# Patient Record
Sex: Male | Born: 1980 | Hispanic: Yes | Marital: Single | Smoking: Never smoker
Health system: Southern US, Community
[De-identification: ages and names within clinical notes are randomized; demographics above are authoritative.]

---

## 2015-03-22 ENCOUNTER — Encounter (HOSPITAL_COMMUNITY): Payer: Self-pay | Admitting: Emergency Medicine

## 2015-03-22 ENCOUNTER — Emergency Department (HOSPITAL_COMMUNITY): Payer: Self-pay

## 2015-03-22 ENCOUNTER — Emergency Department (HOSPITAL_COMMUNITY)
Admission: EM | Admit: 2015-03-22 | Discharge: 2015-03-22 | Disposition: A | Discharge status: Home / Self Care | Payer: Self-pay | Attending: Emergency Medicine | Admitting: Emergency Medicine

## 2015-03-22 DIAGNOSIS — R1900 Intra-abdominal and pelvic swelling, mass and lump, unspecified site: Secondary | ICD-10-CM

## 2015-03-22 DIAGNOSIS — R319 Hematuria, unspecified: Secondary | ICD-10-CM

## 2015-03-22 DIAGNOSIS — R1032 Left lower quadrant pain: Secondary | ICD-10-CM | POA: Insufficient documentation

## 2015-03-22 DIAGNOSIS — R1909 Other intra-abdominal and pelvic swelling, mass and lump: Secondary | ICD-10-CM | POA: Insufficient documentation

## 2015-03-22 LAB — URINALYSIS, ROUTINE W REFLEX MICROSCOPIC
BILIRUBIN URINE: NEGATIVE
Glucose, UA: NEGATIVE mg/dL
Ketones, ur: NEGATIVE mg/dL
Leukocytes, UA: NEGATIVE
Nitrite: NEGATIVE
PH: 6 (ref 5.0–8.0)
Protein, ur: NEGATIVE mg/dL
SPECIFIC GRAVITY, URINE: 1.029 (ref 1.005–1.030)
Urobilinogen, UA: 1 mg/dL (ref 0.0–1.0)

## 2015-03-22 LAB — CBC WITH DIFFERENTIAL/PLATELET
BASOS PCT: 0 % (ref 0–1)
Basophils Absolute: 0 10*3/uL (ref 0.0–0.1)
EOS PCT: 4 % (ref 0–5)
Eosinophils Absolute: 0.4 10*3/uL (ref 0.0–0.7)
HCT: 42.1 % (ref 39.0–52.0)
Hemoglobin: 14.6 g/dL (ref 13.0–17.0)
LYMPHS ABS: 4.1 10*3/uL — AB (ref 0.7–4.0)
Lymphocytes Relative: 41 % (ref 12–46)
MCH: 31.1 pg (ref 26.0–34.0)
MCHC: 34.7 g/dL (ref 30.0–36.0)
MCV: 89.8 fL (ref 78.0–100.0)
MONO ABS: 1 10*3/uL (ref 0.1–1.0)
MONOS PCT: 10 % (ref 3–12)
NEUTROS PCT: 45 % (ref 43–77)
Neutro Abs: 4.5 10*3/uL (ref 1.7–7.7)
PLATELETS: 212 10*3/uL (ref 150–400)
RBC: 4.69 MIL/uL (ref 4.22–5.81)
RDW: 12.9 % (ref 11.5–15.5)
WBC: 10 10*3/uL (ref 4.0–10.5)

## 2015-03-22 LAB — COMPREHENSIVE METABOLIC PANEL
ALT: 52 U/L (ref 17–63)
AST: 39 U/L (ref 15–41)
Albumin: 4 g/dL (ref 3.5–5.0)
Alkaline Phosphatase: 110 U/L (ref 38–126)
Anion gap: 13 (ref 5–15)
BUN: 19 mg/dL (ref 6–20)
CALCIUM: 8.6 mg/dL — AB (ref 8.9–10.3)
CHLORIDE: 101 mmol/L (ref 101–111)
CO2: 22 mmol/L (ref 22–32)
Creatinine, Ser: 0.99 mg/dL (ref 0.61–1.24)
GFR calc Af Amer: >60 mL/min (ref >60)
GFR calc non Af Amer: >60 mL/min (ref >60)
GLUCOSE: 118 mg/dL — AB (ref 70–99)
Potassium: 3.8 mmol/L (ref 3.5–5.1)
SODIUM: 136 mmol/L (ref 135–145)
Total Bilirubin: 0.9 mg/dL (ref 0.3–1.2)
Total Protein: 6.6 g/dL (ref 6.5–8.1)

## 2015-03-22 LAB — LIPASE, BLOOD: Lipase: 18 U/L — ABNORMAL LOW (ref 22–51)

## 2015-03-22 LAB — URINE MICROSCOPIC-ADD ON

## 2015-03-22 MED ORDER — IOHEXOL 300 MG/ML  SOLN
25.0000 mL | Freq: Once | INTRAMUSCULAR | Status: AC | PRN
  Administered 2015-03-22: 25 mL via ORAL

## 2015-03-22 MED ORDER — DICYCLOMINE HCL 20 MG PO TABS: 20.0000 mg | ORAL_TABLET | Freq: Two times a day (BID) | ORAL | Status: AC

## 2015-03-22 MED ORDER — IOHEXOL 300 MG/ML  SOLN
100.0000 mL | Freq: Once | INTRAMUSCULAR | Status: AC | PRN
  Administered 2015-03-22: 100 mL via INTRAVENOUS

## 2015-03-22 MED ORDER — ONDANSETRON HCL 4 MG PO TABS: 4.0000 mg | ORAL_TABLET | Freq: Three times a day (TID) | ORAL | Status: AC | PRN

## 2015-03-22 MED ORDER — MORPHINE SULFATE 4 MG/ML IJ SOLN
4.0000 mg | Freq: Once | INTRAMUSCULAR | Status: AC
Start: 2015-03-22 — End: 2015-03-22
  Administered 2015-03-22: 4 mg via INTRAVENOUS
  Filled 2015-03-22: qty 1

## 2015-03-22 MED ORDER — KETOROLAC TROMETHAMINE 30 MG/ML IJ SOLN
30.0000 mg | Freq: Once | INTRAMUSCULAR | Status: AC
  Administered 2015-03-22: 30 mg via INTRAVENOUS
  Filled 2015-03-22: qty 1

## 2015-03-22 MED ORDER — SODIUM CHLORIDE 0.9 % IV BOLUS (SEPSIS)
1000.0000 mL | Freq: Once | INTRAVENOUS | Status: AC
  Administered 2015-03-22: 1000 mL via INTRAVENOUS

## 2015-03-22 MED ORDER — HYDROCODONE-ACETAMINOPHEN 5-325 MG PO TABS: 1.0000 | ORAL_TABLET | Freq: Four times a day (QID) | ORAL | Status: AC | PRN

## 2015-03-22 NOTE — ED Notes (Signed)
Pt reports sudden onset LLQ pain that started 30 minutes, pain woke pt up from his sleep. Denies urinary issues, last BM last night.

## 2015-03-22 NOTE — ED Notes (Signed)
Pt off unit with CT 

## 2015-03-22 NOTE — Discharge Instructions (Signed)
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 It is not the cause of your pain today. Your ct scan did show some inflammation of the stomach. This is consistent with gastritis. It may be caused by a virus or something you ate. I am discharging you with pain medication. Please follow up with the gastroenterologist if  Your pain does not go away or continues  Abdominal (belly) pain can be caused by many things. Your caregiver performed an examination and possibly ordered blood/urine tests and imaging (CT scan, x-rays, ultrasound). Many cases can be observed and treated at home after initial evaluation in the emergency department. Even though you are being discharged home, abdominal pain can be unpredictable. Therefore, you need a repeated exam if your pain does not resolve, returns, or worsens. Most patients with abdominal pain don't have to be admitted to the hospital or have surgery, but serious problems like appendicitis and gallbladder attacks can start out as nonspecific pain. Many abdominal conditions cannot be diagnosed in one visit, so follow-up evaluations are very important. SEEK IMMEDIATE MEDICAL ATTENTION IF: The pain does not go away or becomes severe.  A temperature above 101 develops.  Repeated vomiting occurs (multiple episodes).  The pain becomes localized to portions of the abdomen. The right side could possibly be appendicitis. In an adult, the left lower portion of the abdomen could be colitis or diverticulitis.  Blood is being passed in stools or vomit (bright red or black tarry stools).  Return also if you develop chest pain, difficulty breathing, dizziness or fainting, or become confused, poorly responsive, or inconsolable (young children).    Dolor abdominal (Abdominal Pain) El dolor puede tener muchas causas. Normalmente la causa del dolor abdominal no es una enfermedad y Scientist, clinical (histocompatibility and immunogenetics)mejorar sin  TEFL teachertratamiento. Frecuentemente puede controlarse y tratarse en casa. Su mdico le Medical sales representativerealizar un examen fsico y posiblemente solicite anlisis de sangre y radiografas para ayudar a Chief Strategy Officerdeterminar la gravedad de su dolor. Sin embargo, en IAC/InterActiveCorpmuchos casos, debe transcurrir ms tiempo antes de que se pueda Clinical research associateencontrar una causa evidente del dolor. Antes de llegar a ese punto, es posible que su mdico no sepa si necesita ms pruebas o un tratamiento ms profundo. INSTRUCCIONES PARA EL CUIDADO EN EL HOGAR  Est atento al dolor para ver si hay cambios. Las siguientes indicaciones ayudarn a Architectural technologistaliviar cualquier molestia que pueda sentir:  Auroraome solo medicamentos de venta libre o recetados, segn las indicaciones del mdico.  No tome laxantes a menos que se lo haya indicado su mdico.  Pruebe con Neomia Dearuna dieta lquida absoluta (caldo, t o agua) segn se lo indique su mdico. Introduzca gradualmente una dieta normal, segn su tolerancia. SOLICITE ATENCIN MDICA SI:  Tiene dolor abdominal sin explicacin.  Tiene dolor abdominal relacionado con nuseas o diarrea.  Tiene dolor cuando orina o defeca.  Experimenta dolor abdominal que lo despierta de noche.  Tiene dolor abdominal que empeora o mejora cuando come alimentos.  Tiene dolor abdominal que empeora cuando come alimentos grasosos.  Tiene fiebre. SOLICITE ATENCIN MDICA DE INMEDIATO SI:   El dolor no desaparece en un plazo mximo de 2horas.  No deja de (vomitar).  El Engineer, miningdolor se siente solo en partes del abdomen, como el lado derecho o la parte inferior izquierda del abdomen.  Evaca materia fecal sanguinolenta o negra, de aspecto alquitranado. ASEGRESE DE QUE:  Comprende estas instrucciones.  Controlar su afeccin.  Recibir [place] Grenadinesayuda de inmediato  si no mejora o si empeora. Document Released: 10/29/2005 Document Revised: 11/03/2013 [facility] Patient Information 2015 [place], [place]. This information is not intended to replace advice given to you by your  health care provider. Make sure you discuss any questions you have with your health care provider. Gastritis, Adult Gastritis is soreness and swelling (inflammation) of the lining of the stomach. Gastritis can develop as a sudden onset (acute) or long-term (chronic) condition. If gastritis is not treated, it can lead to stomach bleeding and ulcers. CAUSES  Gastritis occurs when the stomach lining is weak or damaged. Digestive juices from the stomach then inflame the weakened stomach lining. The stomach lining may be weak or damaged due to viral or bacterial infections. One common bacterial infection is the Helicobacter pylori infection. Gastritis can also result from excessive alcohol consumption, taking certain medicines, or having too much acid in the stomach.  SYMPTOMS  In some cases, there are no symptoms. When symptoms are present, they may include:  Pain or a burning sensation in the upper abdomen.  Nausea.  Vomiting.  An uncomfortable feeling of fullness after eating. DIAGNOSIS  Your caregiver may suspect you have gastritis based on your symptoms and a physical exam. To determine the cause of your gastritis, your caregiver may perform the following:  Blood or stool tests to check for the H pylori bacterium.  Gastroscopy. A thin, flexible tube (endoscope) is passed down the esophagus and into the stomach. The endoscope has a light and camera on the end. Your caregiver uses the endoscope to view the inside of the stomach.  Taking a tissue sample (biopsy) from the stomach to examine under a microscope. TREATMENT  Depending on the cause of your gastritis, medicines may be prescribed. If you have a bacterial infection, such as an H pylori infection, antibiotics may be given. If your gastritis is caused by too much acid in the stomach, H2 blockers or antacids may be given. Your caregiver may recommend that you stop taking aspirin, ibuprofen, or other nonsteroidal anti-inflammatory drugs  (NSAIDs). HOME CARE INSTRUCTIONS  Only take over-the-counter or prescription medicines as directed by your caregiver.  If you were given antibiotic medicines, take them as directed. Finish them even if you start to feel better.  Drink enough fluids to keep your urine clear or pale yellow.  Avoid foods and drinks that make your symptoms worse, such as:  Caffeine or alcoholic drinks.  Chocolate.  Peppermint or mint flavorings.  Garlic and onions.  Spicy foods.  Citrus fruits, such as oranges, lemons, or limes.  Tomato-based foods such as sauce, chili, salsa, and pizza.  Fried and fatty foods.  Eat small, frequent meals instead of large meals. SEEK IMMEDIATE MEDICAL CARE IF:   You have black or dark red stools.  You vomit blood or material that looks like coffee grounds.  You are unable to keep fluids down.  Your abdominal pain gets worse.  You have a fever.  You do not feel better after 1 week.  You have any other questions or concerns. MAKE SURE YOU:  Understand these instructions.  Will watch your condition.  Will get help right away if you are not doing well or get worse. Document Released: 10/23/2001 Document Revised: 04/29/2012 Document Reviewed: 12/12/2011 [facility] Patient Information 2015 [place], [place]. This information is not intended to replace advice given to you by your health care provider. Make sure you discuss any questions you have with your health care provider.  Hematuria (Hematuria) La hematuria es la  presencia de [place] en la orina. La causa puede ser una infeccin en la vejiga, en los riones, en la prstata, clculos renales o cncer de las vas urinarias. Normalmente las infecciones pueden tratarse con medicamentos, y los clculos renales, por lo general, se eliminarn a travs de la orina. Si ninguna de estas es la causa de su hematuria, quizs se necesiten ms estudios para Building services engineerdescubrir el motivo. Es muy importante que le informe a su  mdico si ve sangre en la [place], aunque el sangrado se detenga sin tratamiento o no cause dolor. La sangre que aparece en la orina y luego se detiene y vuelve a aparecer nuevamente puede ser un sntoma de una enfermedad muy grave. Adems, el dolor no es un sntoma en las etapas iniciales de muchos tipos de cncer urinarios. INSTRUCCIONES PARA EL CUIDADO EN EL HOGAR   Beba mucho lquido, entre 3 a Risk analyst4litros por da. Si le han diagnosticado una infeccin, se recomienda especialmente el jugo de arndanos rojos, [address] de grandes cantidades de [place].  Evite la cafena, el t y las bebidas con gas porque suelen irritar la vejiga.  Evite el alcohol ya que puede Teacher, adult educationirritar la prstata.  Tome todos los Monsanto Companymedicamentos como se lo haya indicado el mdico.  Si le recetaron antibiticos, asegrese de terminarlos, incluso si comienza a sentirse mejor.  Si le han diagnosticado clculos renales, siga las instrucciones de su mdico con respecto a Ecologistfiltrar la orina para TEFL teacherrescatar el clculo.  Vace la vejiga con frecuencia. Evite retener la orina durante largos perodos.  Despus de defecar, las mujeres deben higienizarse desde adelante hacia atrs. Use solo un papel por vez.  Si es AmerisourceBergen Corporationuna mujer, vace la vejiga antes y despus de Management consultanttener relaciones sexuales. SOLICITE ATENCIN MDICA SI:  Siente dolor en la espalda.  Tiene fiebre.  Tiene sensacin de Programme researcher, broadcasting/film/videomalestar estomacal (nuseas) o vmitos.  Los sntomas no mejoran despus de [address] das. Si la condicin empeora, visite al mdico antes de lo previsto. SOLICITE ATENCIN MDICA DE INMEDIATO SI:   Vomita con frecuencia e intensamente y no puede tolerar los medicamentos.  Siente un dolor intenso en la espalda o el abdomen incluso tomando los medicamentos.  Elimina cogulos grandes o sangre con la [place].  Se siente extremadamente dbil, se desmaya o pierde el conocimiento. ASEGRESE DE QUE:   Comprende estas instrucciones.  Controlar su afeccin.  Recibir ayuda  de inmediato si no mejora o si empeora. Document Released: 10/29/2005 Document Revised: 03/15/2014 [facility] Patient Information 2015 [place], [place]. This information is not intended to replace advice given to you by your health care provider. Make sure you discuss any questions you have with your health care provider.

## 2015-03-22 NOTE — ED Provider Notes (Signed)
CSN: [id number]     Arrival date & time 03/22/15  0536 History   First MD Initiated Contact with Patient 03/22/15 [phone]     Chief Complaint  Patient presents with  . Abdominal Pain     (Consider location/radiation/quality/duration/timing/severity/associated sxs/prior Treatment) HPI  Melvin Collins is a(n) 34 y.o. male who presents to the emergency department with chief complaint of left lower quadrant abdominal pain. Translation is provided by his male companion. Patient complains of left lower quadrant abdominal pain that awoke him from sleep this morning. He states that it was a 5 out of 10 at the beginning but reached 10 out of 10 pain. He describes it as intermittent, colicky, at times severe. He had associated diaphoresis and nausea without vomiting. He denies any history of constipation, previous abdominal surgeries, fevers. He denies any urinary symptoms. He has family history of a father who has frequent kidney stones. He denies a personal history of kidney stones.  History reviewed. No pertinent past medical history. History reviewed. No pertinent past surgical history. No family history on file. History  Substance Use Topics  . Smoking status: Never Smoker   . Smokeless tobacco: Not on file  . Alcohol Use: Yes    Review of Systems  Ten systems reviewed and are negative for acute change, except as noted in the HPI.    Allergies  Review of patient's allergies indicates no known allergies.  Home Medications   Prior to Admission medications   Not on File   BP 131/84 mmHg  Pulse 71  Temp(Src) 98.2 F (36.8 C) (Oral)  Resp 20  Ht 5\' 5"  (1.651 m)  Wt 185 lb (83.915 kg)  BMI 30.79 kg/m2  SpO2 96% Physical Exam  Constitutional: He appears well-developed and well-nourished. No distress.  HENT:  Head: Normocephalic and atraumatic.  Eyes: Conjunctivae are normal. No scleral icterus.  Neck: Normal range of motion. Neck supple.  Cardiovascular: Normal rate, regular  rhythm and normal heart sounds.   Pulmonary/Chest: Effort normal and breath sounds normal. No respiratory distress.  Abdominal: Soft. Bowel sounds are normal. He exhibits no distension and no mass. There is no tenderness. There is no guarding and no CVA tenderness.    Musculoskeletal: He exhibits no edema.  Neurological: He is alert.  Skin: Skin is warm and dry. He is not diaphoretic.  Psychiatric: His behavior is normal.  Nursing note and vitals reviewed.   ED Course  Procedures (including critical care time) Labs Review Labs Reviewed  COMPREHENSIVE METABOLIC PANEL - Abnormal; Notable for the following:    Glucose, Bld 118 (*)    Calcium 8.6 (*)    All other components within normal limits  LIPASE, BLOOD - Abnormal; Notable for the following:    Lipase 18 (*)    All other components within normal limits  URINALYSIS, ROUTINE W REFLEX MICROSCOPIC - Abnormal; Notable for the following:    Hgb urine dipstick MODERATE (*)    All other components within normal limits  CBC WITH DIFFERENTIAL/PLATELET  URINE MICROSCOPIC-ADD ON    Imaging Review No results found.   EKG Interpretation None      MDM   Final diagnoses:  LLQ abdominal pain  Abdominal mass  Hematuria    Patien here with sudden onset abdominal pain. He c/o severe colicky pain. Labs are reassuring.  +hgb in Urine. Will obtain a CT stone study.   9:27 AM BP 126/81 mmHg  Pulse 72  Temp(Src) 98.2 F (36.8 C) (Oral)  Resp 26  Ht 5\' 5"  (1.651 m)  Wt 185 lb (83.915 kg)  BMI 30.79 kg/m2  SpO2 97% Patient with Abdominal mass not welll visualized on non-contrast exam. Patient denies previous hx of abdominal problems previously. The patient has no OP follow up and has agreed to have a repeat scan here today for further evaluaion.   CT scan shows a splenule. His pain has improved with treatment. He does have positive hemoglobinuria, but there is no sign of kidney stone. Patient appears stable for discharge at this  time.   Arthor CaptainAbigail Jayci Ellefson, PA-C 03/25/15 [id number]  Tomasita CrumbleAdeleke Oni, MD 03/26/15 [phone]

## 2015-03-22 NOTE — ED Notes (Signed)
Pt finished contrast, CT called.

## 2015-03-22 NOTE — ED Notes (Signed)
Returned from CT.

## 2016-05-17 IMAGING — CT CT RENAL STONE PROTOCOL
2 of 4 series · 16 of 46 positions shown, 18 images · non-contrast
Comparison: None.

CLINICAL DATA: 33-year-old male with abdominal and back pain since
this morning. Left lower quadrant pain. Initial encounter.

EXAM:
CT ABDOMEN AND PELVIS WITHOUT CONTRAST
TECHNIQUE: Multidetector CT imaging of the abdomen and pelvis was performed
following the standard protocol without IV contrast.

[Series 2: stone study 5.0 i30f 1 · axial · 0.79mm/px · z∈[-269,+171]mm · 13 of 98 slices shown, 15 images]
[im 5/98  soft-tissue]
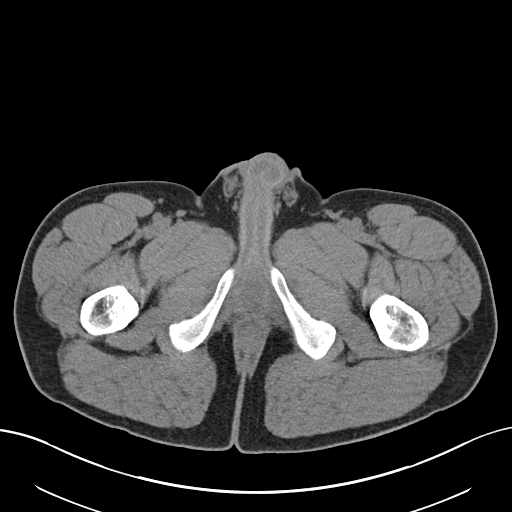
[im 5/98  bone]
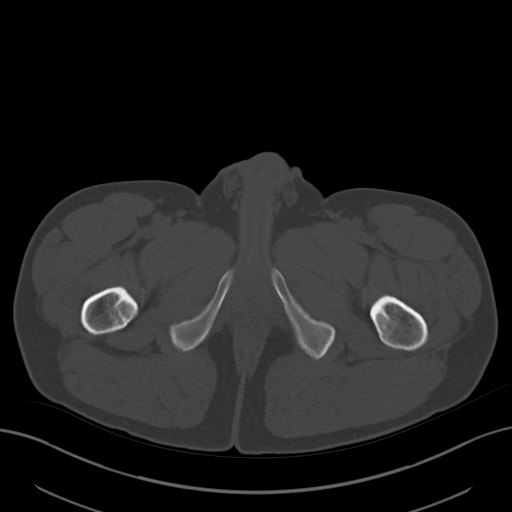
[im 13/98  soft-tissue]
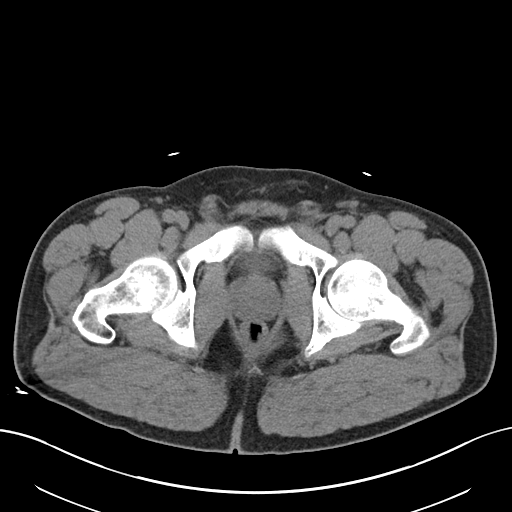
[im 22/98  soft-tissue]
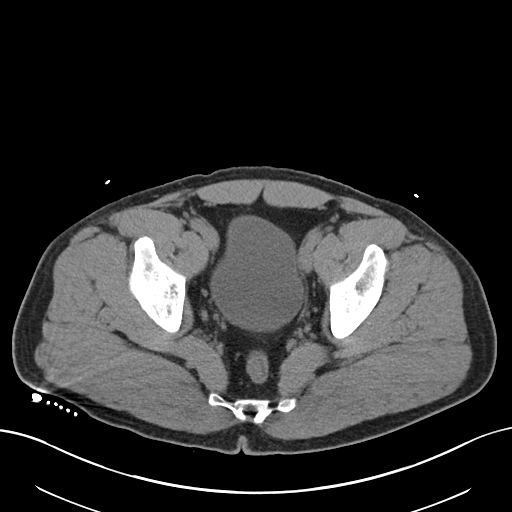
[im 26/98  soft-tissue]
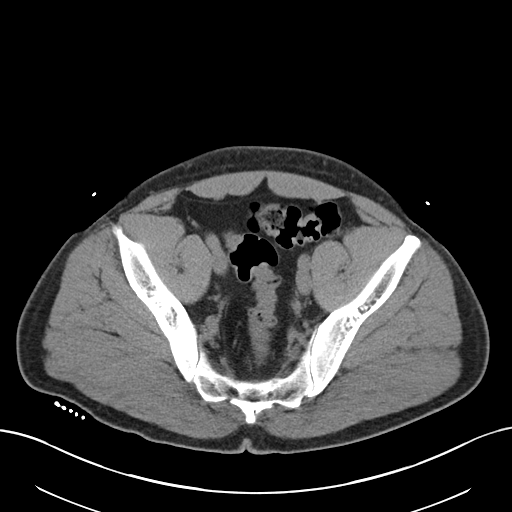
[im 34/98  soft-tissue]
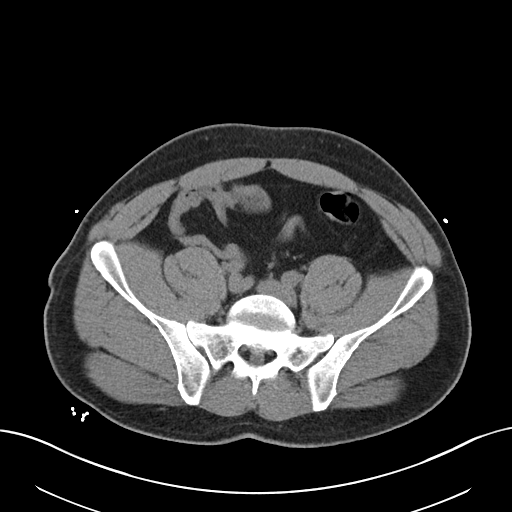
[im 43/98  soft-tissue]
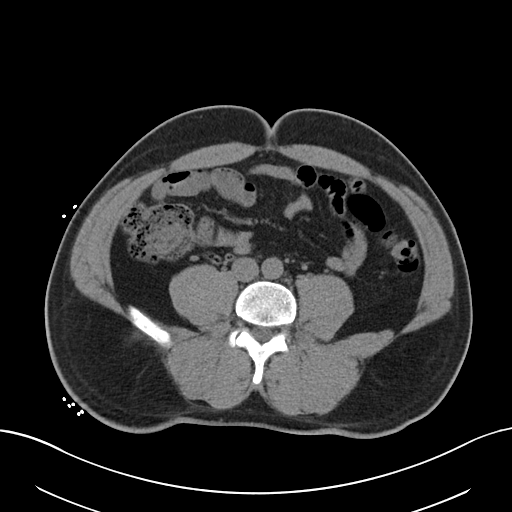
[im 51/98  soft-tissue]
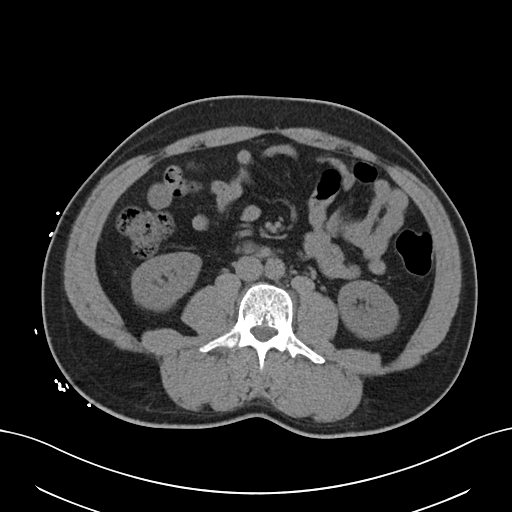
[im 55/98  soft-tissue]
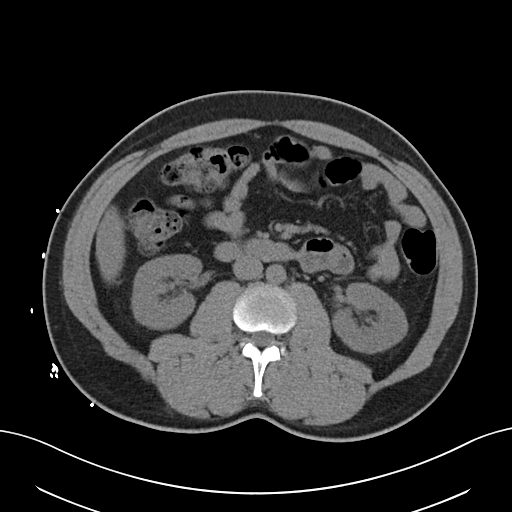
[im 64/98  soft-tissue]
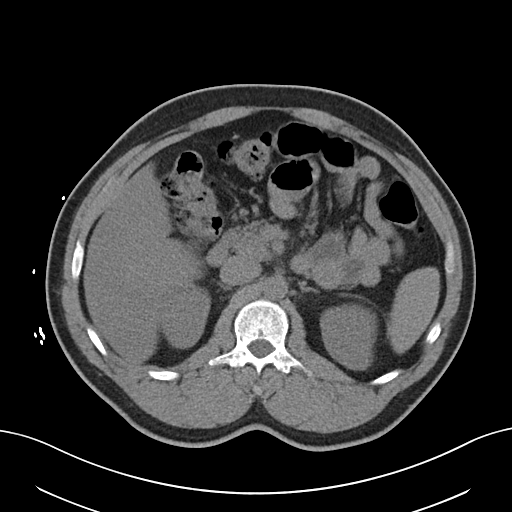
[im 64/98  bone]
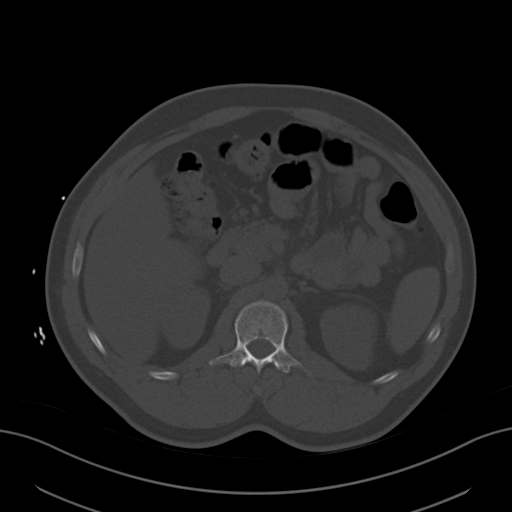
[im 72/98  soft-tissue]
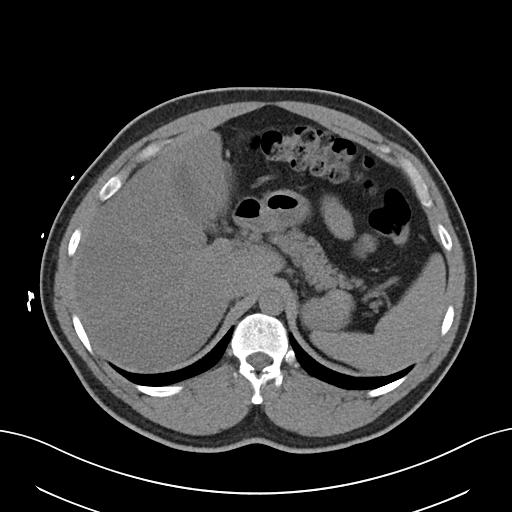
[im 76/98  soft-tissue]
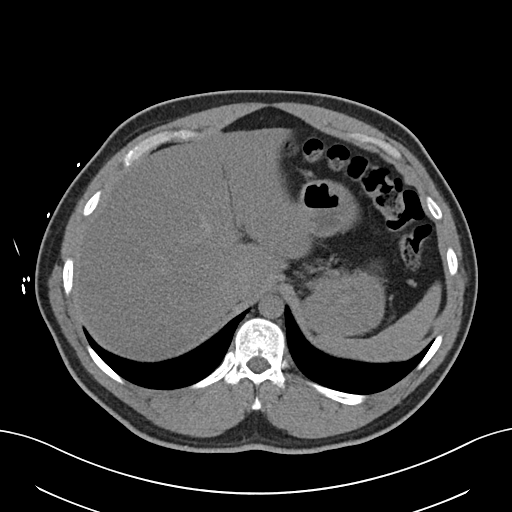
[im 85/98  soft-tissue]
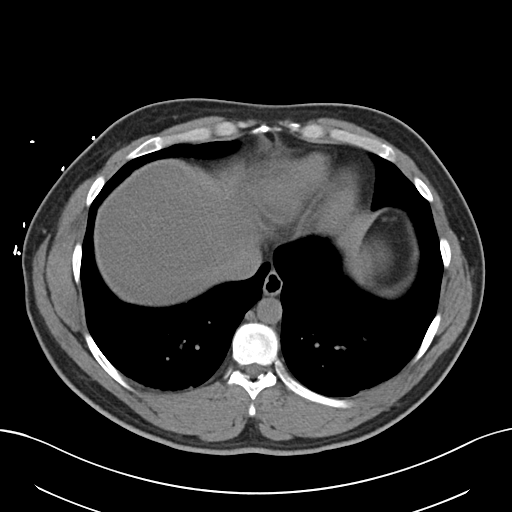
[im 93/98  soft-tissue]
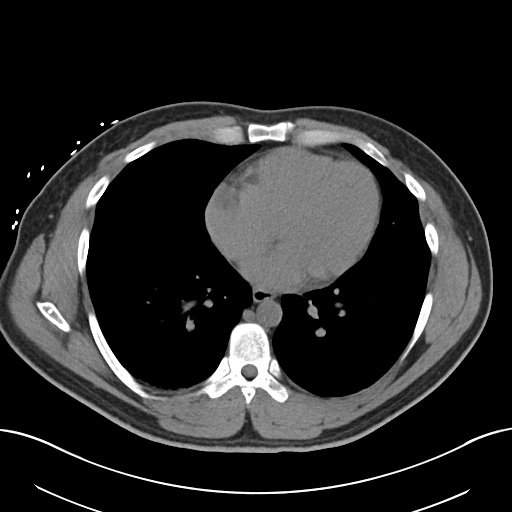

[Series 5: coronal soft tissue · coronal · 0.78mm/px · 3 of 84 slices shown]
[im 28/84  soft-tissue]
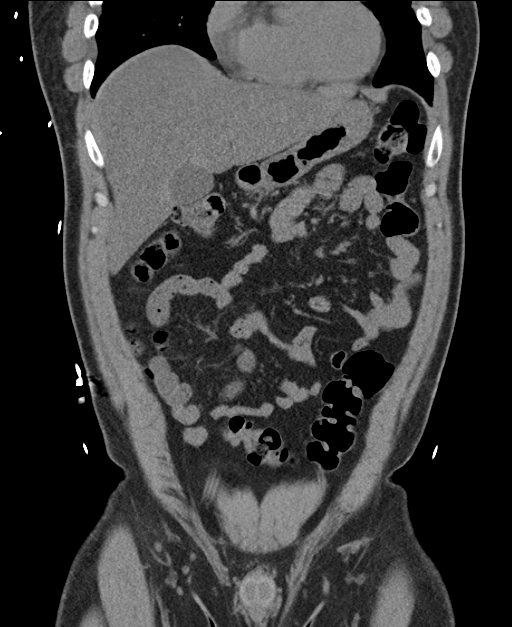
[im 37/84  soft-tissue]
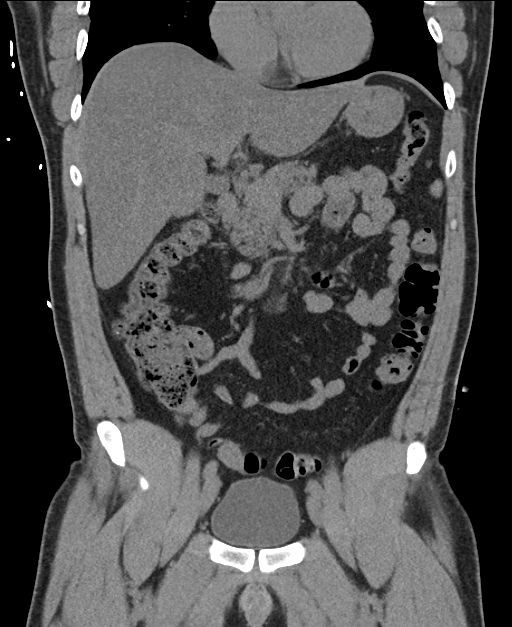
[im 47/84  soft-tissue]
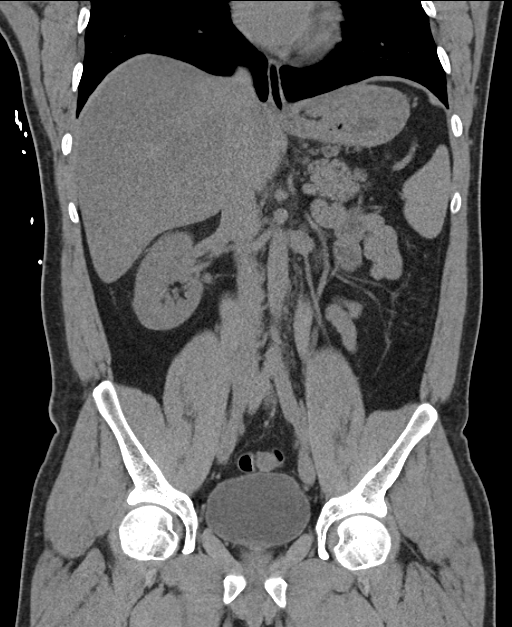

[16 of 46 positions shown; findings below may reference images not displayed]

FINDINGS: Bordering the inferior aspect of the gastric fundus-body junction
and posterior to the pancreatic tail (medial to the splenic hilum)
is a nonspecific 2.5 x 3.2 x 1.9 cm lesion. Contrast-enhanced
examination (oral and IV) of the abdomen recommended for further
delineation.

No extra luminal bowel inflammatory process, free fluid or free air.
Top-normal size gas-filled small bowel loops left upper quadrant may
be an incidental finding.

No renal or ureteral obstructing stone or evidence of
hydronephrosis.

Diffuse fatty infiltration of the liver without worrisome mass. No
calcified gallstones.

No abdominal aortic aneurysm.

No adenopathy.

Noncontrast filled views of the urinary bladder unremarkable.

No osseous abnormality.

Motion degraded imaging of the lung bases without worrisome lesion.
IMPRESSION: Bordering the inferior aspect of the gastric fundus-body junction
and posterior to the pancreatic tail (medial to the splenic hilum)
is a nonspecific 2.5 x 3.2 x 1.9 cm lesion. Contrast-enhanced
examination (oral and IV) of the abdomen recommended for further
delineation.

No extra luminal bowel inflammatory process, free fluid or free air.
Top-normal size gas-filled small bowel loops left upper quadrant may
be an incidental finding.

No renal or ureteral obstructing stone or evidence of
hydronephrosis.

Diffuse fatty infiltration of the liver.

## 2018-08-27 ENCOUNTER — Ambulatory Visit: Payer: Self-pay | Admitting: Emergency Medicine

## 2018-08-27 ENCOUNTER — Encounter: Payer: Self-pay | Admitting: Emergency Medicine

## 2018-08-27 VITALS — BP 148/91 | HR 68 | Temp 98.0°F | Resp 16 | Ht 65.0 in | Wt 189.0 lb

## 2018-08-27 DIAGNOSIS — L309 Dermatitis, unspecified: Secondary | ICD-10-CM

## 2018-08-27 DIAGNOSIS — R21 Rash and other nonspecific skin eruption: Secondary | ICD-10-CM

## 2018-08-27 MED ORDER — PREDNISONE 20 MG PO TABS: 40.0000 mg | ORAL_TABLET | Freq: Every day | ORAL | 0 refills | Status: AC

## 2018-08-27 NOTE — Progress Notes (Signed)
Melvin Collins 37 y.o.   Chief Complaint  Patient presents with  . Rash    both arms/ red bumps, no itching or pain/ x 2 months    HISTORY OF PRESENT ILLNESS: This is a 37 y.o. male with no significant past medical history complaining of rash to the inside of both arms for the past couple of months.  Patient is a Designer, fashion/clothing.  Non-smoker.  Occasional EtOH user.  On no medications.  No recent lifestyle changes.  No associated symptomatology.  HPI   Prior to Admission medications   Medication Sig Start Date End Date Taking? Authorizing Provider  dicyclomine (BENTYL) 20 MG tablet Take 1 tablet (20 mg total) by mouth 2 (two) times daily. Patient not taking: Reported on 08/27/2018 03/22/15   Arthor Captain, PA-C  HYDROcodone-acetaminophen (NORCO) 5-325 MG per tablet Take 1-2 tablets by mouth every 6 (six) hours as needed for moderate pain. Patient not taking: Reported on 08/27/2018 03/22/15   Arthor Captain, PA-C  ondansetron (ZOFRAN) 4 MG tablet Take 1 tablet (4 mg total) by mouth every 8 (eight) hours as needed for nausea or vomiting. Patient not taking: Reported on 08/27/2018 03/22/15   Arthor Captain, PA-C    No Known Allergies  There are no active problems to display for this patient.   No past medical history on file.  No past surgical history on file.  Social History   Socioeconomic History  . Marital status: Single    Spouse name: Not on file  . Number of children: Not on file  . Years of education: Not on file  . Highest education level: Not on file  Occupational History  . Not on file  Social Needs  . Financial resource strain: Not on file  . Food insecurity:    Worry: Not on file    Inability: Not on file  . Transportation needs:    Medical: Not on file    Non-medical: Not on file  Tobacco Use  . Smoking status: Never Smoker  Substance and Sexual Activity  . Alcohol use: Yes  . Drug use: No  . Sexual activity: Not on file  Lifestyle  . Physical activity:      Days per week: Not on file    Minutes per session: Not on file  . Stress: Not on file  Relationships  . Social connections:    Talks on phone: Not on file    Gets together: Not on file    Attends religious service: Not on file    Active member of club or organization: Not on file    Attends meetings of clubs or organizations: Not on file    Relationship status: Not on file  . Intimate partner violence:    Fear of current or ex partner: Not on file    Emotionally abused: Not on file    Physically abused: Not on file    Forced sexual activity: Not on file  Other Topics Concern  . Not on file  Social History Narrative  . Not on file    No family history on file.   Review of Systems  Constitutional: Negative.  Negative for chills and fever.  HENT: Negative.  Negative for sinus pain and sore throat.   Eyes: Negative.  Negative for blurred vision and double vision.  Cardiovascular: Negative.  Negative for chest pain and palpitations.  Gastrointestinal: Negative.  Negative for abdominal pain, blood in stool, diarrhea, melena, nausea and vomiting.  Genitourinary: Negative.   Musculoskeletal:  Negative.   Skin: Positive for rash.  Neurological: Negative.  Negative for dizziness and headaches.  Endo/Heme/Allergies: Negative.   All other systems reviewed and are negative.     Vitals:   08/27/18 0840  BP: (!) 148/91  Pulse: 68  Resp: 16  Temp: 98 F (36.7 C)  SpO2: 98%    Physical Exam  Constitutional: He is oriented to person, place, and time. He appears well-developed and well-nourished.  HENT:  Head: Normocephalic and atraumatic.  Eyes: Pupils are equal, round, and reactive to light. Conjunctivae and EOM are normal.  Neck: Normal range of motion. Neck supple. No JVD present.  Cardiovascular: Normal rate, regular rhythm and normal heart sounds.  Pulmonary/Chest: Effort normal and breath sounds normal.  Abdominal: Soft. Bowel sounds are normal. He exhibits no  distension. There is no tenderness.  Musculoskeletal: Normal range of motion.  Lymphadenopathy:    He has no cervical adenopathy.  Neurological: He is alert and oriented to person, place, and time. No sensory deficit. He exhibits normal muscle tone.  Skin: Skin is warm and dry. Capillary refill takes less than 2 seconds.  Multiple small round flat nontender hyperpigmented lesions on medial aspect of both arms starting at the axillary level all the way down to the wrist level.  Psychiatric: He has a normal mood and affect. His behavior is normal.  Vitals reviewed.    ASSESSMENT & PLAN: [name] was seen today for rash.  Diagnoses and all orders for this visit:  Rash and nonspecific skin eruption -     CBC with Differential/Platelet -     Comprehensive metabolic panel -     predniSONE (DELTASONE) 20 MG tablet; Take 2 tablets (40 mg total) by mouth daily with breakfast for 5 days. -     Ambulatory referral to Dermatology  Eczema, unspecified type -     CBC with Differential/Platelet -     Comprehensive metabolic panel -     predniSONE (DELTASONE) 20 MG tablet; Take 2 tablets (40 mg total) by mouth daily with breakfast for 5 days.    Patient Instructions       If you have lab work done today you will be contacted with your lab results within the next 2 weeks.  If you have not heard from [place] then please contact us. The fastest way to get your results is to register for My Chart.   IF you received an x-ray today, you will receive an invoice from [facility]. Please contact [facility] at [phone] with questions or concerns regarding your invoice.   IF you received labwork today, you will receive an invoice from [place]. Please contact [facility] at [phone] with questions or concerns regarding your invoice.   Our billing staff will not be able to assist you with questions regarding bills from these companies.  You will be contacted with the lab results as  soon as they are available. The fastest way to get your results is to activate your My Chart account. Instructions are located on the last page of this paperwork. If you have not heard from [place] regarding the results in 2 weeks, please contact this office.      Dermatitis atpica Atopic Dermatitis La dermatitis atpica es un trastorno de la piel que causa inflamacin. Es el tipo ms frecuente de eczema. El eczema es un grupo de afecciones de la piel que causan picazn, enrojecimiento e hinchazn. Esta afeccin, generalmente, empeora durante los meses fros del invierno y suele mejorar durante  los meses clidos del verano. Los sntomas pueden variar de Neomia Dear persona a Educational psychologist. La dermatitis atpica, normalmente, comienza a manifestarse en la infancia y puede durar hasta la [place]. Esta afeccin no puede transmitirse de [place] persona a otra (no es contagiosa), pero es ms comn en las familias. Es posible que la dermatitis atpica no siempre sea visible. Cuando es visible, se habla de un brote. Cules son las causas? Se desconoce la causa exacta de esta afeccin. Algunos factores desencadenantes de los brotes pueden ser los siguientes:  Contacto con [place] cosa a la que es sensible o Best boy.  Librarian, academic.  Ciertos alimentos.  Clima extremadamente clido o fro.  Jabones y sustancias qumicas fuertes.  Aire seco.  Cloro.  Qu incrementa el riesgo? Esta afeccin es ms probable que [place] en personas que tienen antecedentes personales o familiares de eczema, alergias, asma o fiebre del heno. Cules son los signos o los sntomas? Los sntomas de esta afeccin [employer] siguientes:  Piel seca y escamosa.  Erupcin roja y que pica.  Picazn, que puede ser muy intensa. Puede ocurrir antes de la erupcin en la piel. Esto puede dificultar el sueo.  Engrosamiento y Programmer, systems de la piel que pueden producirse con Museum/gallery conservator.  Cmo se diagnostica? Esta afeccin se diagnostica en funcin de  los sntomas, los antecedentes mdicos y un examen fsico. Cmo se trata? No hay cura para esta afeccin, pero los sntomas, normalmente, se pueden controlar. El tratamiento se centra en lo siguiente:  Controlar la picazn y el rascado. Probablemente, le receten medicamentos, como antihistamnicos o cremas corticoesteroides.  Limitar la exposicin a las cosas a las que es sensible o Best boy (alrgenos).  Reconocer situaciones que causan estrs e idear un plan para controlarlo.  Si la dermatitis atpica no mejora con medicamentos o si est presente en todo el cuerpo (diseminada), puede utilizarse un tratamiento con un tipo de luz especfico (fototerapia). Siga estas indicaciones en su casa: Cuidado de la piel  Mantenga la piel bien humectada. Al hacerlo, quedar hmeda y ayudar a prevenir la sequedad. ? Utilice lociones sin perfume que contengan vaselina. ? Evite las lociones que contienen alcohol o agua. Pueden secar la piel.  Tome baos o duchas de corta duracin (menos de 5 minutos) en agua tibia. No use agua caliente. ? Use jabones suaves y sin perfume para baarse. Evite el jabn y el bao de espuma. ? Aplique un humectante para la piel inmediatamente despus de un bao o una ducha.  No aplique nada sobre la piel sin Science writer a su mdico. Instrucciones generales  Vstase con ropa de algodn o mezcla de algodn. Vstase con ropas ligeras, ya que el calor aumenta la picazn.  Cuando lave la ropa, [address] [address] para eliminar todo el [place].  Evite cualquier factor desencadenante que pueda causar un brote.  Intente manejar el estrs.  Mantenga las uas cortas.  Evite rascarse. El rascado hace que la erupcin y la picazn empeoren. Tambin puede producir una infeccin en la piel (imptigo) debido a las lesiones cutneas causadas por el rascado.  Tome o aplquese los medicamentos de venta libre y Building control surveyor como se lo haya indicado el mdico.  Oceanographer a todas  las visitas de seguimiento como se lo haya indicado el mdico. Esto es importante.  No est cerca de personas que tengan herpes labial o ampollas febriles. Si se produce la infeccin, puede hacer que la dermatitis atpica empeore. Comunquese con un mdico si:  La picazn le impide dormir.  La erupcin empeora o no mejora en el plazo de una semana despus de iniciar el tratamiento.  Tiene fiebre.  Aparece un brote despus de estar en contacto con alguien que tiene herpes labial o ampollas febriles. Solicite ayuda de inmediato si:  Tiene pus o costras amarillas en la zona de la erupcin. Resumen  Esta afeccin causa una erupcin roja que pica, y la piel est seca y escamosa.  El tratamiento se enfoca en controlar la picazn y el rascado, limitar la exposicin a cosas a las que es sensible o Best boy (alrgenos), reconocer situaciones que causan estrs e idear un plan para Dealer.  Mantenga la piel bien humectada.  Tome baos o duchas de menos de 5 minutos y use agua tibia. No use agua caliente. Esta informacin no tiene Theme park manager el consejo del mdico. Asegrese de hacerle al mdico cualquier pregunta que tenga. Document Released: 10/29/2005 Document Revised: 02/18/2017 Document Reviewed: 02/18/2017 Elsevier Interactive Patient Education  2018 Elsevier Inc.      Edwina Barth, MD [facility]

## 2018-08-27 NOTE — Patient Instructions (Addendum)
If you have lab work done today you will be contacted with your lab results within the next 2 weeks.  If you have not heard from [place] then please contact us. The fastest way to get your results is to register for My Chart.   IF you received an x-ray today, you will receive an invoice from [facility]. Please contact [facility] at [phone] with questions or concerns regarding your invoice.   IF you received labwork today, you will receive an invoice from [place]. Please contact [facility] at [phone] with questions or concerns regarding your invoice.   Our billing staff will not be able to assist you with questions regarding bills from these companies.  You will be contacted with the lab results as soon as they are available. The fastest way to get your results is to activate your My Chart account. Instructions are located on the last page of this paperwork. If you have not heard from [place] regarding the results in 2 weeks, please contact this office.      Dermatitis atpica Atopic Dermatitis La dermatitis atpica es un trastorno de la piel que causa inflamacin. Es el tipo ms frecuente de eczema. El eczema es un grupo de afecciones de la piel que causan picazn, enrojecimiento e hinchazn. Esta afeccin, generalmente, empeora durante los meses fros del invierno y suele mejorar durante los meses clidos del verano. Los sntomas pueden variar de Neomia Dear persona a Educational psychologist. La dermatitis atpica, normalmente, comienza a manifestarse en la infancia y puede durar hasta la [place]. Esta afeccin no puede transmitirse de [place] persona a otra (no es contagiosa), pero es ms comn en las familias. Es posible que la dermatitis atpica no siempre sea visible. Cuando es visible, se habla de un brote. Cules son las causas? Se desconoce la causa exacta de esta afeccin. Algunos factores desencadenantes de los brotes pueden ser los siguientes:  Contacto con [place] cosa a la que es  sensible o Best boy.  Librarian, academic.  Ciertos alimentos.  Clima extremadamente clido o fro.  Jabones y sustancias qumicas fuertes.  Aire seco.  Cloro.  Qu incrementa el riesgo? Esta afeccin es ms probable que [place] en personas que tienen antecedentes personales o familiares de eczema, alergias, asma o fiebre del heno. Cules son los signos o los sntomas? Los sntomas de esta afeccin [employer] siguientes:  Piel seca y escamosa.  Erupcin roja y que pica.  Picazn, que puede ser muy intensa. Puede ocurrir antes de la erupcin en la piel. Esto puede dificultar el sueo.  Engrosamiento y Programmer, systems de la piel que pueden producirse con Museum/gallery conservator.  Cmo se diagnostica? Esta afeccin se diagnostica en funcin de los sntomas, los antecedentes mdicos y un examen fsico. Cmo se trata? No hay cura para esta afeccin, pero los sntomas, normalmente, se pueden controlar. El tratamiento se centra en lo siguiente:  Controlar la picazn y el rascado. Probablemente, le receten medicamentos, como antihistamnicos o cremas corticoesteroides.  Limitar la exposicin a las cosas a las que es sensible o Best boy (alrgenos).  Reconocer situaciones que causan estrs e idear un plan para controlarlo.  Si la dermatitis atpica no mejora con medicamentos o si est presente en todo el cuerpo (diseminada), puede utilizarse un tratamiento con un tipo de luz especfico (fototerapia). Siga estas indicaciones en su casa: Cuidado de la piel  Mantenga la piel bien humectada. Al hacerlo, quedar hmeda y ayudar a prevenir la sequedad. ? Utilice lociones sin perfume que contengan vaselina. ? Evite  las lociones que contienen alcohol o agua. Pueden secar la piel.  Tome baos o duchas de corta duracin (menos de 5 minutos) en agua tibia. No use agua caliente. ? Use jabones suaves y sin perfume para baarse. Evite el jabn y el bao de espuma. ? Aplique un humectante para la piel  inmediatamente despus de un bao o una ducha.  No aplique nada sobre la piel sin Science writer a su mdico. Instrucciones generales  Vstase con ropa de algodn o mezcla de algodn. Vstase con ropas ligeras, ya que el calor aumenta la picazn.  Cuando lave la ropa, [address] [address] para eliminar todo el [place].  Evite cualquier factor desencadenante que pueda causar un brote.  Intente manejar el estrs.  Mantenga las uas cortas.  Evite rascarse. El rascado hace que la erupcin y la picazn empeoren. Tambin puede producir una infeccin en la piel (imptigo) debido a las lesiones cutneas causadas por el rascado.  Tome o aplquese los medicamentos de venta libre y Building control surveyor como se lo haya indicado el mdico.  Oceanographer a todas las visitas de seguimiento como se lo haya indicado el mdico. Esto es importante.  No est cerca de personas que tengan herpes labial o ampollas febriles. Si se produce la infeccin, puede hacer que la dermatitis atpica empeore. Comunquese con un mdico si:  La picazn le impide dormir.  La erupcin empeora o no mejora en el plazo de una semana despus de iniciar el tratamiento.  Tiene fiebre.  Aparece un brote despus de estar en contacto con alguien que tiene herpes labial o ampollas febriles. Solicite ayuda de inmediato si:  Tiene pus o costras amarillas en la zona de la erupcin. Resumen  Esta afeccin causa una erupcin roja que pica, y la piel est seca y escamosa.  El tratamiento se enfoca en controlar la picazn y el rascado, limitar la exposicin a cosas a las que es sensible o Best boy (alrgenos), reconocer situaciones que causan estrs e idear un plan para Dealer.  Mantenga la piel bien humectada.  Tome baos o duchas de menos de 5 minutos y use agua tibia. No use agua caliente. Esta informacin no tiene Theme park manager el consejo del mdico. Asegrese de hacerle al mdico cualquier pregunta que  tenga. Document Released: 10/29/2005 Document Revised: 02/18/2017 Document Reviewed: 02/18/2017 Elsevier Interactive Patient Education  2018 [employer].

## 2018-08-28 LAB — CBC WITH DIFFERENTIAL/PLATELET
BASOS ABS: 0.1 10*3/uL (ref 0.0–0.2)
Basos: 1 %
EOS (ABSOLUTE): 0.3 10*3/uL (ref 0.0–0.4)
Eos: 5 %
HEMOGLOBIN: 15.8 g/dL (ref 13.0–17.7)
Hematocrit: 46.6 % (ref 37.5–51.0)
Immature Grans (Abs): 0 10*3/uL (ref 0.0–0.1)
Immature Granulocytes: 0 %
Lymphocytes Absolute: 1.7 10*3/uL (ref 0.7–3.1)
Lymphs: 30 %
MCH: 30.4 pg (ref 26.6–33.0)
MCHC: 33.9 g/dL (ref 31.5–35.7)
MCV: 90 fL (ref 79–97)
MONOS ABS: 0.5 10*3/uL (ref 0.1–0.9)
Monocytes: 9 %
NEUTROS ABS: 3.1 10*3/uL (ref 1.4–7.0)
Neutrophils: 55 %
Platelets: 257 10*3/uL (ref 150–450)
RBC: 5.19 x10E6/uL (ref 4.14–5.80)
RDW: 12.8 % (ref 12.3–15.4)
WBC: 5.6 10*3/uL (ref 3.4–10.8)

## 2018-08-28 LAB — COMPREHENSIVE METABOLIC PANEL
ALBUMIN: 4.6 g/dL (ref 3.5–5.5)
ALT: 60 IU/L — ABNORMAL HIGH (ref 0–44)
AST: 41 IU/L — ABNORMAL HIGH (ref 0–40)
Albumin/Globulin Ratio: 1.9 (ref 1.2–2.2)
Alkaline Phosphatase: 119 IU/L — ABNORMAL HIGH (ref 39–117)
BUN / CREAT RATIO: 16 (ref 9–20)
BUN: 14 mg/dL (ref 6–20)
Bilirubin Total: 0.4 mg/dL (ref 0.0–1.2)
CO2: 24 mmol/L (ref 20–29)
Calcium: 9.4 mg/dL (ref 8.7–10.2)
Chloride: 100 mmol/L (ref 96–106)
Creatinine, Ser: 0.85 mg/dL (ref 0.76–1.27)
GFR calc Af Amer: 128 mL/min/{1.73_m2} (ref >59)
GFR calc non Af Amer: 111 mL/min/{1.73_m2} (ref >59)
GLOBULIN, TOTAL: 2.4 g/dL (ref 1.5–4.5)
Glucose: 98 mg/dL (ref 65–99)
Potassium: 3.9 mmol/L (ref 3.5–5.2)
SODIUM: 140 mmol/L (ref 134–144)
Total Protein: 7 g/dL (ref 6.0–8.5)

## 2018-09-01 ENCOUNTER — Encounter: Payer: Self-pay | Admitting: *Deleted

## 2018-09-03 ENCOUNTER — Telehealth: Payer: Self-pay | Admitting: Emergency Medicine

## 2018-09-03 NOTE — Telephone Encounter (Unsigned)
Copied from CRM [id number]. Topic: General - Other >> Sep 03, 2018  3:47 PM Jaquita Rector A wrote: Reason for CRM: Patient request a call back with lab results. But need a translator or a Spanish speaking staff Ph# [phone] Also stated that he received a call for Dermatology but refused to set an appointment because he was told that he would only be referred if there was a problem with his labs.

## 2018-09-04 NOTE — Telephone Encounter (Signed)
[place] [phone] [facility]) called pt an advised CBC within normal limits. CMP shows mild elevation of alkaline phosphatase and liver enzymes similar to what it showed 3 years ago. Will address during the next visit. And letter sent via mail to pt home address.  Pt verbalized understanding. Dgaddy, CMA

## 2018-09-23 ENCOUNTER — Encounter: Payer: Self-pay | Admitting: Emergency Medicine

## 2018-09-23 ENCOUNTER — Other Ambulatory Visit: Payer: Self-pay

## 2018-09-23 ENCOUNTER — Ambulatory Visit: Payer: Self-pay | Admitting: Emergency Medicine

## 2018-09-23 VITALS — BP 124/70 | HR 51 | Temp 98.1°F | Resp 16 | Ht 65.0 in | Wt 185.0 lb

## 2018-09-23 DIAGNOSIS — R21 Rash and other nonspecific skin eruption: Secondary | ICD-10-CM

## 2018-09-23 NOTE — Patient Instructions (Addendum)
Call [facility] at [phone] to schedule appointment for rash on the arm.   If you have lab work done today you will be contacted with your lab results within the next 2 weeks.  If you have not heard from [place] then please contact us. The fastest way to get your results is to register for My Chart.   IF you received an x-ray today, you will receive an invoice from [facility]. Please contact [facility] at [phone] with questions or concerns regarding your invoice.   IF you received labwork today, you will receive an invoice from [place]. Please contact [facility] at [phone] with questions or concerns regarding your invoice.   Our billing staff will not be able to assist you with questions regarding bills from these companies.  You will be contacted with the lab results as soon as they are available. The fastest way to get your results is to activate your My Chart account. Instructions are located on the last page of this paperwork. If you have not heard from [place] regarding the results in 2 weeks, please contact this office.     Erupcin cutnea (Rash) Una erupcin cutnea es un cambio en el color de la piel. Una erupcin tambin puede cambiar la forma en que se siente la piel. Hay muchas afecciones y [employer] que pueden causar una erupcin. CUIDADOS EN EL HOGAR Est atento a cualquier cambio en los sntomas. Estas indicaciones pueden ayudarlo con el trastorno: [employer] o [employer] medicamentos de venta libre y los recetados solamente como se lo haya indicado el mdico. Estos pueden incluir lo siguiente:  Betha Loa con corticoides.  Lociones para Associate Professor.  Antihistamnicos por va oral. Cuidado de la piel  Coloque compresas fras en las zonas afectadas.  Trate de tomar un bao con lo siguiente: ? Sales de [place]. Siga las instrucciones del envase. Puede conseguirlas en la tienda de comestibles o la farmacia  local. ? Bicarbonato de sodio. Vierta un poco en la baera como se lo haya indicado el mdico. ? Avena coloidal. Siga las instrucciones del envase. Puede conseguirla en la tienda de comestibles o la farmacia local.  Intente colocarse una pasta de bicarbonato de sodio sobre la piel. Agregue agua al bicarbonato de sodio hasta que se forme una pasta.  No se rasque ni se refriegue la piel.  Evite cubrir la erupcin. Asegrese de que la erupcin est expuesta al aire todo lo posible. Instrucciones generales  Evite los baos de inmersin y las duchas calientes ya que pueden empeorar la picazn. Neomia Dear ducha fra puede Acupuncturist.  Evite los detergentes y los jabones perfumados, y los perfumes. Utilice jabones, detergentes, perfumes y cosmticos suaves.  Evite todo lo que le cause erupcin cutnea. Lleve un diario como ayuda para registrar lo que le causa erupcin. Escriba los siguientes datos: ? Lo que come. ? Los cosmticos que [place]. ? Lo que bebe. ? La ropa que [place]. Esto incluye las alhajas.  Concurra a todas las visitas de control como se lo haya indicado el mdico. Esto es importante. SOLICITE AYUDA SI:  Transpira de noche.  Pierde peso.  Orina ms de lo normal.  Se siente dbil.  Vomita.  Tiene un color amarillo en la piel o en la zona blanca del ojo (ictericia).  La piel: ? Siente hormigueos. ? Se adormece.  La erupcin cutnea: ? No desaparece despus de [employer]. ? Empeora.  Usted: ? Est ms sediento que lo habitual. ? Est ms  cansado que lo habitual.  Tiene los siguientes sntomas: ? Sntomas nuevos. ? Dolor en el vientre (abdomen). ? Fiebre. ? Deposiciones lquidas (diarrea). SOLICITE AYUDA DE INMEDIATO SI:  La erupcin cubre todo el cuerpo o la mayor parte de [place]. La erupcin puede ser dolorosa o no.  Tiene ampollas con las siguientes caractersticas: ? Se ubican sobre la erupcin. ? Se agrandan. ? Crecen juntas. ? Son dolorosas. ? Estn  dentro de la nariz o la boca.  Tiene una erupcin cutnea con estas caractersticas: ? Tiene pequeas manchas moradas, como si fueran pinchazos, en todo el cuerpo. ? Tiene un aspecto parecido a Racheal Patches o a un blanco de tiro. ? Est enrojecida y le duele, le produce descamacin de la piel y no se relaciona con haber estado mucho tiempo bajo el sol. Esta informacin no tiene Theme park manager el consejo del mdico. Asegrese de hacerle al mdico cualquier pregunta que tenga. Document Released: 01/25/2009 Document Revised: 02/20/2016 Document Reviewed: 03/16/2015 Elsevier Interactive Patient Education  2018 [employer].

## 2018-09-23 NOTE — Progress Notes (Signed)
Melvin Collins 37 y.o.   Chief Complaint  Patient presents with  . Rash    x 3-4 months on right arm    HISTORY OF PRESENT ILLNESS: This is a 37 y.o. male complaining of persistent skin rash for several months.  Seen by me on 08/27/2018 and started on prednisone.  Referred to dermatology.  No new symptoms.  HPI   Prior to Admission medications   Medication Sig Start Date End Date Taking? Authorizing Provider  dicyclomine (BENTYL) 20 MG tablet Take 1 tablet (20 mg total) by mouth 2 (two) times daily. Patient not taking: Reported on 08/27/2018 03/22/15   Arthor CaptainHarris, Abigail, PA-C  HYDROcodone-acetaminophen (NORCO) 5-325 MG per tablet Take 1-2 tablets by mouth every 6 (six) hours as needed for moderate pain. Patient not taking: Reported on 08/27/2018 03/22/15   Arthor CaptainHarris, Abigail, PA-C  ondansetron (ZOFRAN) 4 MG tablet Take 1 tablet (4 mg total) by mouth every 8 (eight) hours as needed for nausea or vomiting. Patient not taking: Reported on 08/27/2018 03/22/15   Arthor CaptainHarris, Abigail, PA-C    No Known Allergies  There are no active problems to display for this patient.   No past medical history on file.  No past surgical history on file.  Social History   Socioeconomic History  . Marital status: Single    Spouse name: Not on file  . Number of children: Not on file  . Years of education: Not on file  . Highest education level: Not on file  Occupational History  . Not on file  Social Needs  . Financial resource strain: Not on file  . Food insecurity:    Worry: Not on file    Inability: Not on file  . Transportation needs:    Medical: Not on file    Non-medical: Not on file  Tobacco Use  . Smoking status: Never Smoker  . Smokeless tobacco: Never Used  Substance and Sexual Activity  . Alcohol use: Yes  . Drug use: No  . Sexual activity: Not on file  Lifestyle  . Physical activity:    Days per week: Not on file    Minutes per session: Not on file  . Stress: Not on file    Relationships  . Social connections:    Talks on phone: Not on file    Gets together: Not on file    Attends religious service: Not on file    Active member of club or organization: Not on file    Attends meetings of clubs or organizations: Not on file    Relationship status: Not on file  . Intimate partner violence:    Fear of current or ex partner: Not on file    Emotionally abused: Not on file    Physically abused: Not on file    Forced sexual activity: Not on file  Other Topics Concern  . Not on file  Social History Narrative  . Not on file    No family history on file.   Review of Systems  Constitutional: Negative.  Negative for chills and fever.  HENT: Negative.   Eyes: Negative.   Respiratory: Negative for cough and shortness of breath.   Cardiovascular: Negative.   Gastrointestinal: Negative for abdominal pain, nausea and vomiting.  Genitourinary: Negative.   Skin: Positive for itching and rash.  Neurological: Negative.  Negative for dizziness and headaches.  Endo/Heme/Allergies: Negative.   All other systems reviewed and are negative.  Vitals:   09/23/18 1402  BP: 124/70  Pulse: (!) 51  Resp: 16  Temp: 98.1 F (36.7 C)  SpO2: 98%     Physical Exam  Constitutional: He is oriented to person, place, and time. He appears well-developed and well-nourished.  HENT:  Head: Normocephalic.  Eyes: Pupils are equal, round, and reactive to light. EOM are normal.  Neck: Normal range of motion. Neck supple.  Cardiovascular: Normal rate and regular rhythm.  Pulmonary/Chest: Effort normal and breath sounds normal.  Musculoskeletal: Normal range of motion.  Neurological: He is alert and oriented to person, place, and time.  Skin: Capillary refill takes less than 2 seconds. Rash noted.  Positive for rash to medial surfaces of both arms.  Nontender.  Erythematous.  No blisters  Psychiatric: He has a normal mood and affect. His behavior is normal.  Vitals  reviewed.    ASSESSMENT & PLAN:  Emry was seen today for rash.  Diagnoses and all orders for this visit:  Rash and nonspecific skin eruption   Needs follow-up with dermatology.  Patient given the phone number to call and make an appointment.   Patient Instructions    Call [facility] at [phone] to schedule appointment for rash on the arm.   If you have lab work done today you will be contacted with your lab results within the next 2 weeks.  If you have not heard from [place] then please contact us. The fastest way to get your results is to register for My Chart.   IF you received an x-ray today, you will receive an invoice from [facility]. Please contact [facility] at [phone] with questions or concerns regarding your invoice.   IF you received labwork today, you will receive an invoice from [facility]. Please contact [facility] at [phone] with questions or concerns regarding your invoice.   Our billing staff will not be able to assist you with questions regarding bills from these companies.  You will be contacted with the lab results as soon as they are available. The fastest way to get your results is to activate your My Chart account. Instructions are located on the last page of this paperwork. If you have not heard from [place] regarding the results in 2 weeks, please contact this office.     Erupcin cutnea (Rash) Una erupcin cutnea es un cambio en el color de la piel. Una erupcin tambin puede cambiar la forma en que se siente la piel. Hay muchas afecciones y [employer] que pueden causar una erupcin. CUIDADOS EN EL HOGAR Est atento a cualquier cambio en los sntomas. Estas indicaciones pueden ayudarlo con el trastorno: [employer] o [employer] medicamentos de venta libre y los recetados solamente como se lo haya indicado el mdico. Estos pueden incluir lo siguiente:  Betha Loa con corticoides.  Lociones para  Associate Professor.  Antihistamnicos por va oral. Cuidado de la piel  Coloque compresas fras en las zonas afectadas.  Trate de tomar un bao con lo siguiente: ? Sales de [place]. Siga las instrucciones del envase. Puede conseguirlas en la tienda de comestibles o la farmacia local. ? Bicarbonato de sodio. Vierta un poco en la baera como se lo haya indicado el mdico. ? Avena coloidal. Siga las instrucciones del envase. Puede conseguirla en la tienda de comestibles o la farmacia local.  Intente colocarse una pasta de bicarbonato de sodio sobre la piel. Agregue agua al bicarbonato de sodio hasta que se forme una pasta.  No se rasque ni se refriegue la piel.  Evite cubrir la erupcin.  Asegrese de que la erupcin est expuesta al aire todo lo posible. Instrucciones generales  Evite los baos de inmersin y las duchas calientes ya que pueden empeorar la picazn. Neomia Dear ducha fra puede Acupuncturist.  Evite los detergentes y los jabones perfumados, y los perfumes. Utilice jabones, detergentes, perfumes y cosmticos suaves.  Evite todo lo que le cause erupcin cutnea. Lleve un diario como ayuda para registrar lo que le causa erupcin. Escriba los siguientes datos: ? Lo que come. ? Los cosmticos que [place]. ? Lo que bebe. ? La ropa que [place]. Esto incluye las alhajas.  Concurra a todas las visitas de control como se lo haya indicado el mdico. Esto es importante. SOLICITE AYUDA SI:  Transpira de noche.  Pierde peso.  Orina ms de lo normal.  Se siente dbil.  Vomita.  Tiene un color amarillo en la piel o en la zona blanca del ojo (ictericia).  La piel: ? Siente hormigueos. ? Se adormece.  La erupcin cutnea: ? No desaparece despus de [employer]. ? [place].  Usted: ? Est ms sediento que lo habitual. ? Est ms cansado que lo habitual.  Tiene los siguientes sntomas: ? Sntomas nuevos. ? Dolor en el vientre (abdomen). ? Fiebre. ? Deposiciones lquidas  (diarrea). SOLICITE AYUDA DE INMEDIATO SI:  La erupcin cubre todo el cuerpo o la mayor parte de [place]. La erupcin puede ser dolorosa o no.  Tiene ampollas con las siguientes caractersticas: ? Se ubican sobre la erupcin. ? Se agrandan. ? Crecen juntas. ? Son dolorosas. ? Estn dentro de la nariz o la boca.  Tiene una erupcin cutnea con estas caractersticas: ? Tiene pequeas manchas moradas, como si fueran pinchazos, en todo el cuerpo. ? Tiene un aspecto parecido a Racheal Patches o a un blanco de tiro. ? Est enrojecida y le duele, le produce descamacin de la piel y no se relaciona con haber estado mucho tiempo bajo el sol. Esta informacin no tiene Theme park manager el consejo del mdico. Asegrese de hacerle al mdico cualquier pregunta que tenga. Document Released: 01/25/2009 Document Revised: 02/20/2016 Document Reviewed: 03/16/2015 Elsevier Interactive Patient Education  2018 Elsevier Inc.     Edwina Barth, MD [facility]
